# Patient Record
Sex: Female | Born: 1990 | Race: Black or African American | Hispanic: No | Marital: Single | Smoking: Never smoker
Health system: Southern US, Community
[De-identification: ages and names within clinical notes are randomized; demographics above are authoritative.]

---

## 2014-04-24 ENCOUNTER — Encounter (HOSPITAL_COMMUNITY): Payer: Self-pay | Admitting: Emergency Medicine

## 2014-04-24 ENCOUNTER — Emergency Department (INDEPENDENT_AMBULATORY_CARE_PROVIDER_SITE_OTHER)
Admission: EM | Admit: 2014-04-24 | Discharge: 2014-04-24 | Disposition: A | Discharge status: Home / Self Care | Payer: No Typology Code available for payment source | Source: Home / Self Care | Attending: Emergency Medicine | Admitting: Emergency Medicine

## 2014-04-24 ENCOUNTER — Emergency Department (INDEPENDENT_AMBULATORY_CARE_PROVIDER_SITE_OTHER): Payer: No Typology Code available for payment source

## 2014-04-24 DIAGNOSIS — L089 Local infection of the skin and subcutaneous tissue, unspecified: Secondary | ICD-10-CM

## 2014-04-24 DIAGNOSIS — IMO0002 Reserved for concepts with insufficient information to code with codable children: Secondary | ICD-10-CM

## 2014-04-24 DIAGNOSIS — Z23 Encounter for immunization: Secondary | ICD-10-CM

## 2014-04-24 DIAGNOSIS — T148XXA Other injury of unspecified body region, initial encounter: Principal | ICD-10-CM

## 2014-04-24 MED ORDER — SULFAMETHOXAZOLE-TMP DS 800-160 MG PO TABS: 2.0000 | ORAL_TABLET | Freq: Two times a day (BID) | ORAL | Status: AC

## 2014-04-24 MED ORDER — MUPIROCIN 2 % EX OINT: 1.0000 "application " | TOPICAL_OINTMENT | Freq: Three times a day (TID) | CUTANEOUS | Status: AC

## 2014-04-24 MED ORDER — BACITRACIN 500 UNIT/GM EX OINT
1.0000 "application " | TOPICAL_OINTMENT | Freq: Once | CUTANEOUS | Status: AC
  Administered 2014-04-24: 1 via TOPICAL

## 2014-04-24 MED ORDER — TETANUS-DIPHTH-ACELL PERTUSSIS 5-2.5-18.5 LF-MCG/0.5 IM SUSP
0.5000 mL | Freq: Once | INTRAMUSCULAR | Status: AC
  Administered 2014-04-24: 0.5 mL via INTRAMUSCULAR

## 2014-04-24 MED ORDER — TETANUS-DIPHTH-ACELL PERTUSSIS 5-2.5-18.5 LF-MCG/0.5 IM SUSP
INTRAMUSCULAR | Status: AC
  Filled 2014-04-24: qty 0.5

## 2014-04-24 NOTE — ED Notes (Addendum)
Reports right dorsal foot "rug burn" which occurred 9 days ago.  4 days ago started with increased pain, swelling, "bruising".  Reports abrasion appearing yellow yesterday.  Abrasion noted; no obvious signs of infection noted.  Has been applying Neosporin.

## 2014-04-24 NOTE — Discharge Instructions (Signed)
Wash with soap and water 3 times daily and apply antibiotic ointment and non-stick dressing.  Wear comfortable shoes.  Watch for signs of worsening infection.

## 2014-04-24 NOTE — ED Provider Notes (Signed)
Chief Complaint   Wound Check   History of Present Illness   Laura Bray is a 23 year old female who sustained an abrasion over the dorsum of her right foot 10 days ago, sliding across a hardwood floor. This has not healed well since then, it's been discolored, swollen, tender to palpation, and draining some pus. She denies any fever or chills. There is no swelling of the ankle the leg. No red streak running up the leg. She is able to walk and move her foot normally. She cannot recall when her last tetanus vaccine was.  Review of Systems   Other than as noted above, the patient denies any of the following symptoms: Systemic:  No fevers or chills. Musculoskeletal:  No joint pain or arthritis.  Neurological:  No muscular weakness, paresthesias.   PMFSH   Past medical history, family history, social history, meds, and allergies were reviewed.     Physical  Examination     Vital signs:  BP 120/79  Pulse 79  Temp(Src) 98.5 F (36.9 C) (Oral)  Resp 16  SpO2 99% Gen:  Alert and oriented times 3.  In no distress. Musculoskeletal:  Exam of the foot reveals there is a 1 cm abrasion over the dorsum of the foot with some surrounding hyperpigmentation, swelling, and tenderness to palpation. All joints have a full range of motion.  Otherwise, all joints had a full a ROM with no swelling, bruising or deformity.  No edema, pulses full. Extremities were warm and pink.  Capillary refill was brisk.  Skin:  Clear, warm and dry.  No rash. Neuro:  Alert and oriented times 3.  Muscle strength was normal.  Sensation was intact to light touch.       Radiology   Dg Foot Complete Right  04/24/2014   CLINICAL DATA:  Injury 10 days ago. Swelling and pain at the top of the foot.  EXAM: RIGHT FOOT COMPLETE - 3+ VIEW  COMPARISON:  None.  FINDINGS: Negative for a fracture or dislocation. Soft tissues are within normal limits. No evidence for a radiopaque foreign body.  IMPRESSION: No acute bone abnormality.    Electronically Signed   By: Richarda Overlie M.D.   On: 04/24/2014 16:08   I reviewed the images independently and personally and concur with the radiologist's findings.  [facility]   Given a Tdap vaccine.  Assessment   The encounter diagnosis was Infected abrasion.  Plan    1.  Meds:  The following meds were prescribed:   Discharge Medication List as of 04/24/2014  4:01 PM    START taking these medications   Details  mupirocin ointment (BACTROBAN) 2 % Apply 1 application topically 3 (three) times daily., Starting 04/24/2014, Until Discontinued, Normal    sulfamethoxazole-trimethoprim (BACTRIM DS) 800-160 MG per tablet Take 2 tablets by mouth 2 (two) times daily., Starting 04/24/2014, Until Discontinued, Normal        2.  Patient Education/Counseling:  The patient was given appropriate handouts, self care instructions, and instructed in symptomatic relief including rest and activity, elevation, application of ice and compression.  She was instructed in wound care and given strict return [place] including symptoms of increasing pain, swelling, or fever.  3.  Follow up:  The patient was told to follow up here if no better in 3 to 4 days, or sooner if becoming worse in any way, and given some red flag symptoms such as worsening pain or neurological symptoms which would prompt immediate return.  Reuben Likes, MD 04/24/14 [phone]

## 2014-06-14 ENCOUNTER — Encounter (HOSPITAL_COMMUNITY): Payer: Self-pay | Admitting: *Deleted

## 2014-06-14 ENCOUNTER — Emergency Department (INDEPENDENT_AMBULATORY_CARE_PROVIDER_SITE_OTHER)
Admission: EM | Admit: 2014-06-14 | Discharge: 2014-06-14 | Disposition: A | Discharge status: Home / Self Care | Payer: No Typology Code available for payment source | Source: Home / Self Care | Attending: Emergency Medicine | Admitting: Emergency Medicine

## 2014-06-14 ENCOUNTER — Other Ambulatory Visit (HOSPITAL_COMMUNITY)
Admission: RE | Admit: 2014-06-14 | Discharge: 2014-06-14 | Disposition: A | Discharge status: Home / Self Care | Payer: No Typology Code available for payment source | Source: Ambulatory Visit | Attending: Emergency Medicine | Admitting: Emergency Medicine

## 2014-06-14 DIAGNOSIS — N760 Acute vaginitis: Secondary | ICD-10-CM

## 2014-06-14 DIAGNOSIS — B9689 Other specified bacterial agents as the cause of diseases classified elsewhere: Secondary | ICD-10-CM

## 2014-06-14 DIAGNOSIS — A499 Bacterial infection, unspecified: Secondary | ICD-10-CM

## 2014-06-14 LAB — POCT URINALYSIS DIP (DEVICE)
Glucose, UA: NEGATIVE mg/dL
HGB URINE DIPSTICK: NEGATIVE
KETONES UR: NEGATIVE mg/dL
Leukocytes, UA: NEGATIVE
Nitrite: NEGATIVE
PH: 7 (ref 5.0–8.0)
PROTEIN: NEGATIVE mg/dL
Specific Gravity, Urine: >=1.03 (ref 1.005–1.030)
Urobilinogen, UA: 1 mg/dL (ref 0.0–1.0)

## 2014-06-14 LAB — POCT PREGNANCY, URINE: PREG TEST UR: NEGATIVE

## 2014-06-14 MED ORDER — METRONIDAZOLE 500 MG PO TABS: 500.0000 mg | ORAL_TABLET | Freq: Two times a day (BID) | ORAL | Status: AC

## 2014-06-14 NOTE — ED Provider Notes (Signed)
CSN: [id number]     Arrival date & time 06/14/14  1952 History   First MD Initiated Contact with Patient 06/14/14 2004     Chief Complaint  Patient presents with  . Vaginal Discharge   (Consider location/radiation/quality/duration/timing/severity/associated sxs/prior Treatment) HPI She is a 23 year old woman here for evaluation of vaginal discharge. She states her symptoms started yesterday with an odor, then she developed a brownish discharge today. She describes the odor as iron like. She denies any abdominal pain or vaginal pain. No fevers or chills. No nausea or vomiting. No dysuria.she is sexually active with one partner. She has a Sales promotion account executivekyla in place. She was tested for gonorrhea and Chlamydia last month at her primary care provider's office; this testing was negative.  History reviewed. No pertinent past medical history. History reviewed. No pertinent past surgical history. History reviewed. No pertinent family history. History  Substance Use Topics  . Smoking status: Never Smoker   . Smokeless tobacco: Not on file  . Alcohol Use: Yes     Comment: occasional   OB History    No data available     Review of Systems  Constitutional: Negative for fever.  Gastrointestinal: Negative for nausea and abdominal pain.  Genitourinary: Positive for vaginal discharge. Negative for dysuria and pelvic pain.    Allergies  Review of patient's allergies indicates no known allergies.  Home Medications   Prior to Admission medications   Medication Sig Start Date End Date Taking? Authorizing Provider  Levonorgestrel (SKYLA) 13.5 MG IUD by Intrauterine route.    Historical Provider, MD  metroNIDAZOLE (FLAGYL) 500 MG tablet Take 1 tablet (500 mg total) by mouth 2 (two) times daily. 06/14/14   Charm RingsErin J Honig, MD  mupirocin ointment (BACTROBAN) 2 % Apply 1 application topically 3 (three) times daily. 04/24/14   Reuben Likesavid C Keller, MD  sulfamethoxazole-trimethoprim (BACTRIM DS) 800-160 MG per tablet Take 2  tablets by mouth 2 (two) times daily. 04/24/14   Reuben Likesavid C Keller, MD   BP 104/72 mmHg  Pulse 62  Temp(Src) 99.3 F (37.4 C) (Oral)  Resp 16  SpO2 100% Physical Exam  Constitutional: She is oriented to person, place, and time. She appears well-developed and well-nourished. No distress.  Cardiovascular: Normal rate.   Pulmonary/Chest: Effort normal.  Genitourinary:    There is no rash on the right labia. There is no rash on the left labia. No signs of injury around the vagina. Vaginal discharge found.  Neurological: She is alert and oriented to person, place, and time.    ED Course  Procedures (including critical care time) Labs Review Labs Reviewed  POCT URINALYSIS DIP (DEVICE) - Abnormal; Notable for the following:    Bilirubin Urine SMALL (*)    All other components within normal limits  POCT PREGNANCY, URINE  CERVICOVAGINAL ANCILLARY ONLY    Imaging Review No results found.   MDM   1. BV (bacterial vaginosis)    Exam is consistent with BV. We'll treat with Flagyl for 1 week. Swab sent for wet prep and trichomonas. Will not retest for gonorrhea and chlamydia as she had a negative in the last month. Follow-up as needed.    Charm RingsErin J Honig, MD 06/14/14 2037

## 2014-06-14 NOTE — Discharge Instructions (Signed)
Take Flagyl 1 pill twice a day for 10 days.  Followup as needed.

## 2014-06-14 NOTE — ED Notes (Signed)
C/o vaginal discharge with an iron smell onset today.  The smell onset yesterday.

## 2014-06-16 LAB — CERVICOVAGINAL ANCILLARY ONLY
Wet Prep (BD Affirm): NEGATIVE
Wet Prep (BD Affirm): NEGATIVE
Wet Prep (BD Affirm): POSITIVE — AB

## 2014-06-16 NOTE — ED Notes (Signed)
Candida and Trich neg., Gardnerella pos.  Pt. adequately treated with Flagyl. Vassie MoselleYork, Ridhi Hoffert M 06/16/2014

## 2014-11-08 ENCOUNTER — Emergency Department (INDEPENDENT_AMBULATORY_CARE_PROVIDER_SITE_OTHER)
Admission: EM | Admit: 2014-11-08 | Discharge: 2014-11-08 | Disposition: A | Discharge status: Home / Self Care | Payer: No Typology Code available for payment source | Source: Home / Self Care | Attending: Family Medicine | Admitting: Family Medicine

## 2014-11-08 ENCOUNTER — Encounter (HOSPITAL_COMMUNITY): Payer: Self-pay | Admitting: *Deleted

## 2014-11-08 DIAGNOSIS — N644 Mastodynia: Secondary | ICD-10-CM

## 2014-11-08 DIAGNOSIS — A499 Bacterial infection, unspecified: Secondary | ICD-10-CM

## 2014-11-08 DIAGNOSIS — N760 Acute vaginitis: Secondary | ICD-10-CM

## 2014-11-08 DIAGNOSIS — B9689 Other specified bacterial agents as the cause of diseases classified elsewhere: Secondary | ICD-10-CM

## 2014-11-08 MED ORDER — METRONIDAZOLE 0.75 % VA GEL: 1.0000 | Freq: Every day | VAGINAL | Status: AC

## 2014-11-08 NOTE — ED Notes (Signed)
Pt  Reports     Swollen  Area  To  l  Breast          With         Swelling  l  Arm           Symptoms  X  1.5  Weeks    She  Reports  As   Well  Symptoms  Of a  Clear  Vaginal  dsicharge  With an  Unusual odor

## 2014-11-08 NOTE — ED Provider Notes (Signed)
CSN: [id number]     Arrival date & time 11/08/14  1110 History   First MD Initiated Contact with Patient 11/08/14 1231     Chief Complaint  Patient presents with  . Breast Problem   (Consider location/radiation/quality/duration/timing/severity/associated sxs/prior Treatment) Patient is a 24 y.o. female presenting with female genitourinary complaint. The history is provided by the patient.  Female GU Problem This is a new problem. The current episode started more than 1 week ago (1.5wks). The problem has not changed since onset.Associated symptoms include chest pain. Pertinent negatives include no abdominal pain. Associated symptoms comments: Left breast soreness and vag odor.Marland Kitchen.    History reviewed. No pertinent past medical history. History reviewed. No pertinent past surgical history. History reviewed. No pertinent family history. History  Substance Use Topics  . Smoking status: Never Smoker   . Smokeless tobacco: Not on file  . Alcohol Use: Yes     Comment: occasional   OB History    No data available     Review of Systems  Constitutional: Negative.   Respiratory: Negative.   Cardiovascular: Positive for chest pain.  Gastrointestinal: Negative for abdominal pain.  Genitourinary: Negative for vaginal bleeding, vaginal discharge, menstrual problem and pelvic pain.    Allergies  Review of patient's allergies indicates no known allergies.  Home Medications   Prior to Admission medications   Medication Sig Start Date End Date Taking? Authorizing Provider  Levonorgestrel (SKYLA) 13.5 MG IUD by Intrauterine route.    Historical Provider, MD  metroNIDAZOLE (FLAGYL) 500 MG tablet Take 1 tablet (500 mg total) by mouth 2 (two) times daily. 06/14/14   Charm RingsErin J Honig, MD  metroNIDAZOLE (METROGEL VAGINAL) 0.75 % vaginal gel Place 1 Applicatorful vaginally at bedtime. For 5 nights 11/08/14   Linna HoffJames D Kindl, MD  mupirocin ointment (BACTROBAN) 2 % Apply 1 application topically 3 (three)  times daily. 04/24/14   Reuben Likesavid C Keller, MD  sulfamethoxazole-trimethoprim (BACTRIM DS) 800-160 MG per tablet Take 2 tablets by mouth 2 (two) times daily. 04/24/14   Reuben Likesavid C Keller, MD   BP 103/66 mmHg  Pulse 74  Temp(Src) 99.6 F (37.6 C) (Oral)  Resp 12  SpO2 100%  LMP 10/28/2014 Physical Exam  Constitutional: She is oriented to person, place, and time. She appears well-developed and well-nourished.  Cardiovascular: Normal rate, regular rhythm and normal heart sounds.   Pulmonary/Chest: Effort normal and breath sounds normal. She exhibits tenderness. Left breast exhibits tenderness. Left breast exhibits no inverted nipple, no mass, no nipple discharge and no skin change. Breasts are symmetrical.    Abdominal: Soft. Bowel sounds are normal. She exhibits no mass. There is no tenderness.  Neurological: She is alert and oriented to person, place, and time.  Skin: Skin is warm and dry.  Nursing note and vitals reviewed.   ED Course  Procedures (including critical care time) Labs Review Labs Reviewed - No data to display  Imaging Review No results found.   MDM   1. Breast pain, left   2. BV (bacterial vaginosis)        Linna HoffJames D Kindl, MD 11/08/14 1256

## 2016-06-19 IMAGING — CR DG FOOT COMPLETE 3+V*R*
3 series · 3 of 3 positions shown · non-contrast
Comparison: None.

CLINICAL DATA: Injury 10 days ago. Swelling and pain at the top of
the foot.

EXAM:
RIGHT FOOT COMPLETE - 3+ VIEW

[foot ap]
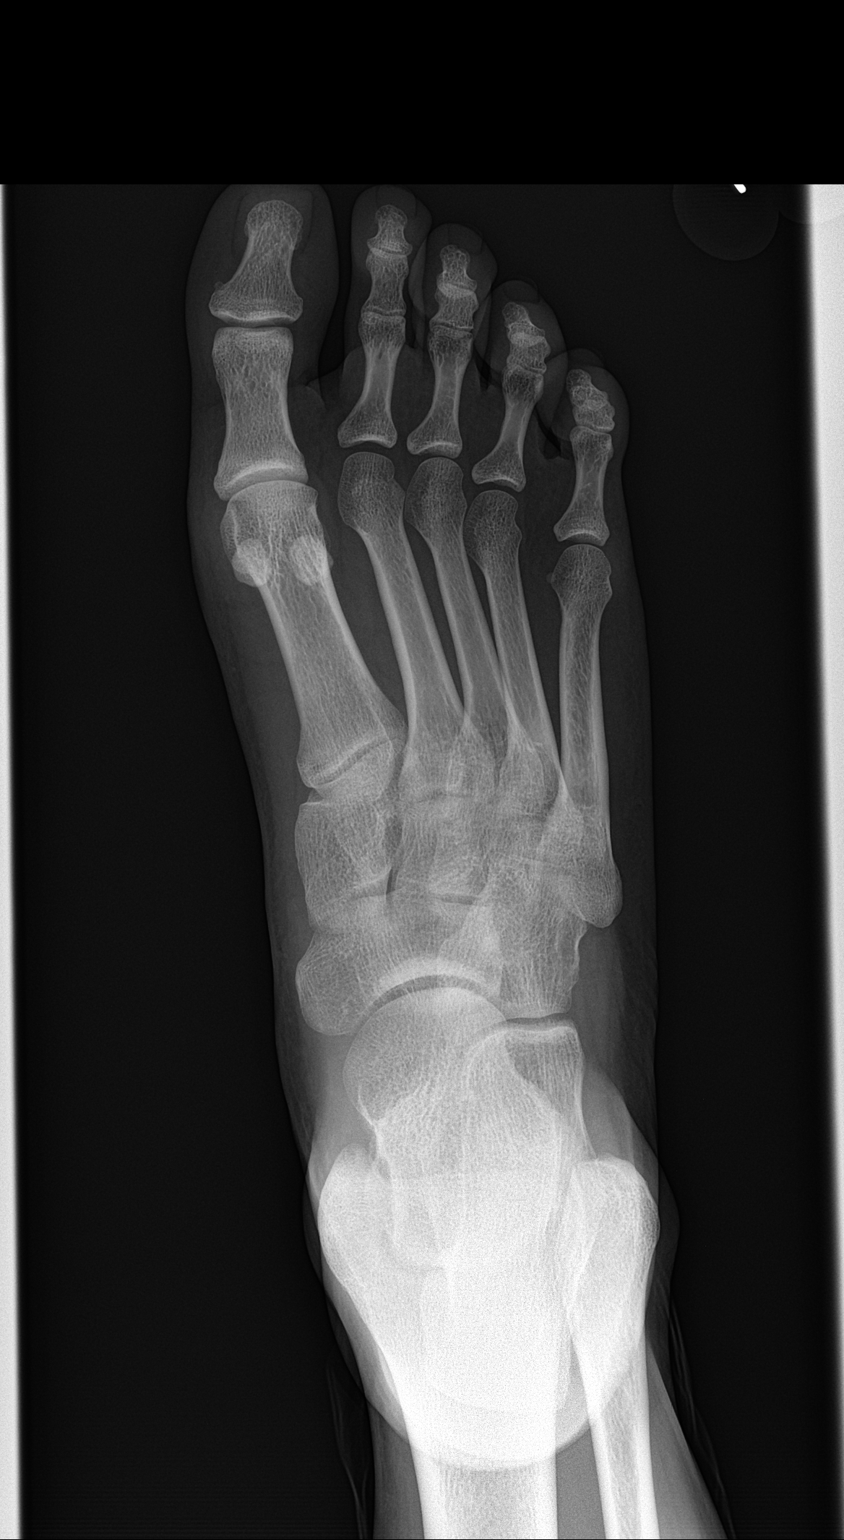

[foot obl]
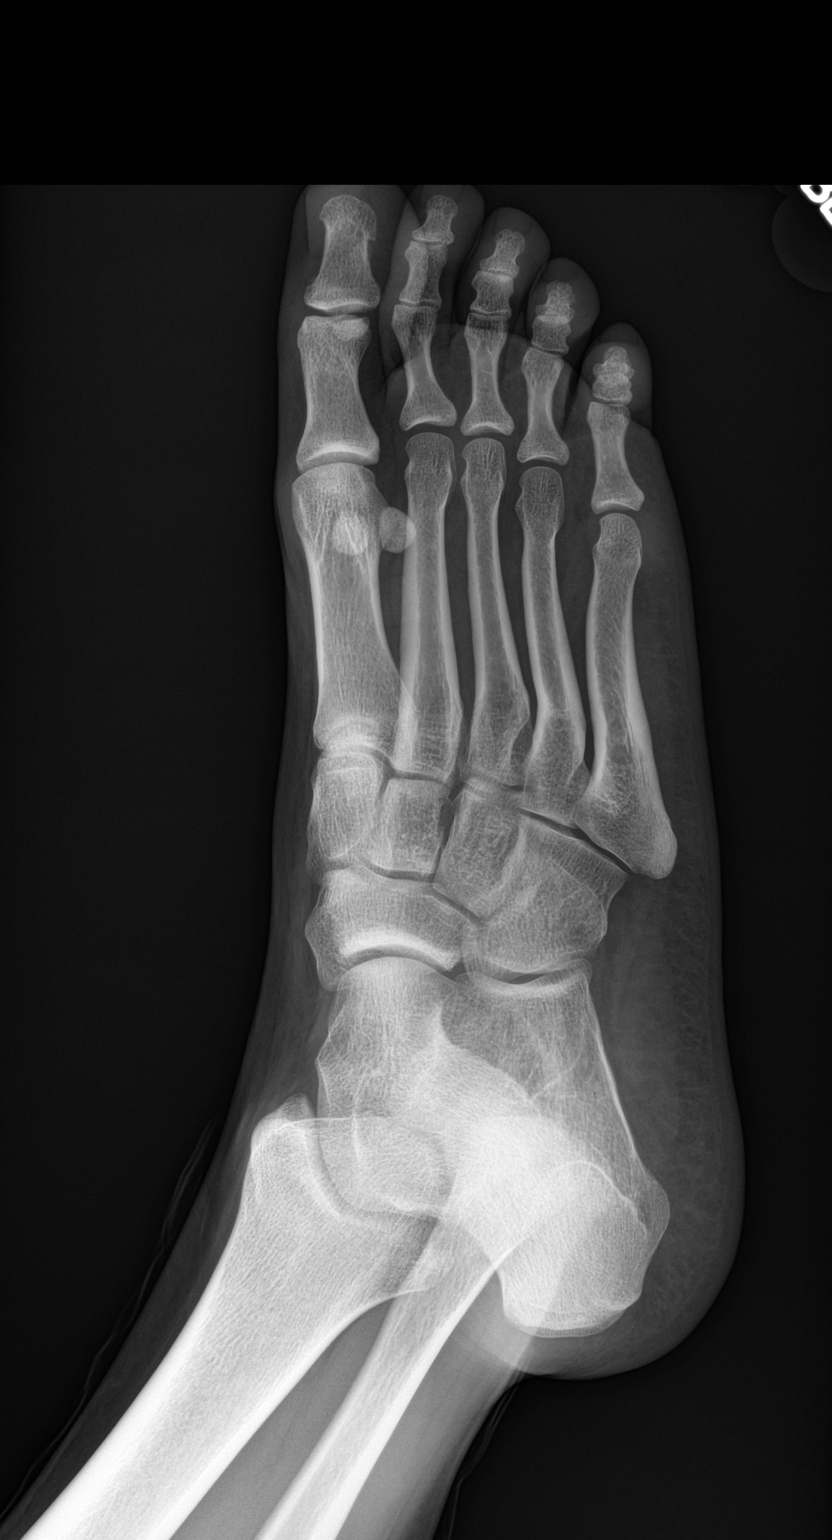

[foot lat]
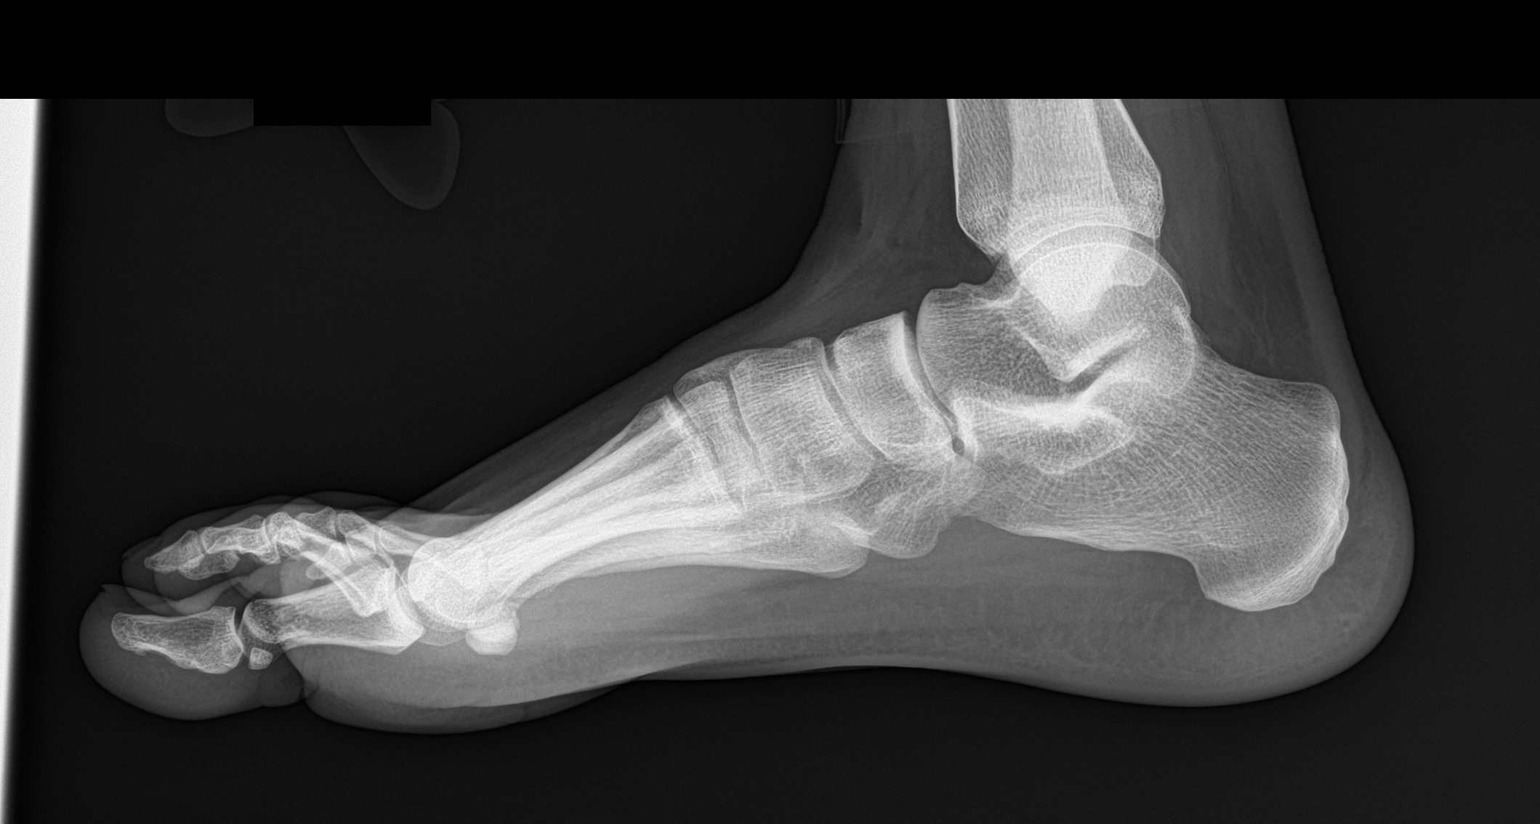

[3 of 3 positions shown; findings below may reference images not displayed]

FINDINGS: Negative for a fracture or dislocation. Soft tissues are within
normal limits. No evidence for a radiopaque foreign body.
IMPRESSION: No acute bone abnormality.
